# Patient Record
Sex: Female | Born: 1955 | Hispanic: Yes
Health system: Northeastern US, Academic
[De-identification: ages and names within clinical notes are randomized; demographics above are authoritative.]

---

## 2019-06-07 ENCOUNTER — Ambulatory Visit: Admit: 2019-06-07 | Payer: PRIVATE HEALTH INSURANCE

## 2019-06-12 ENCOUNTER — Ambulatory Visit: Admit: 2019-06-12 | Payer: PRIVATE HEALTH INSURANCE

## 2019-06-12 ENCOUNTER — Ambulatory Visit: Admit: 2019-06-12 | Payer: PRIVATE HEALTH INSURANCE | Attending: Neurology

## 2019-07-01 ENCOUNTER — Ambulatory Visit: Admit: 2019-07-01 | Payer: PRIVATE HEALTH INSURANCE

## 2019-07-22 ENCOUNTER — Inpatient Hospital Stay: Admit: 2019-07-22 | Discharge: 2019-07-22 | Payer: MEDICARE

## 2019-07-25 ENCOUNTER — Telehealth: Admit: 2019-07-25 | Payer: PRIVATE HEALTH INSURANCE | Attending: Neurology

## 2019-07-25 ENCOUNTER — Ambulatory Visit: Admit: 2019-07-25 | Payer: MEDICARE | Attending: Neurology

## 2019-07-25 NOTE — Telephone Encounter
LVM to inquire about 2:30 pm telehealth appt that patient did not log on to. I asked that they please contact our office if they plan on logging on or to r/s appt.

## 2019-08-06 ENCOUNTER — Telehealth: Admit: 2019-08-06 | Payer: PRIVATE HEALTH INSURANCE | Attending: Neurology

## 2019-08-06 NOTE — Telephone Encounter
Patient's sister, Hulan Fess, called wanting to make Dr. Mikal Plane and team aware that Nielle will be going to [place] very soon for an unknown length of time.Hulan Fess states that her brother will be bringing her there and she will be followed by a doctor there. Hulan Fess let me know that her brother will be passing Dr. Jackelyn Knife contact information to that doctor and that they may be in touch with him regarding Rithika.Just an FYI- no call back needed

## 2019-09-11 ENCOUNTER — Ambulatory Visit: Admit: 2019-09-11 | Payer: PRIVATE HEALTH INSURANCE | Attending: "Endocrinology

## 2019-09-12 ENCOUNTER — Telehealth: Admit: 2019-09-12 | Payer: PRIVATE HEALTH INSURANCE | Attending: "Endocrinology

## 2019-09-12 NOTE — Telephone Encounter
appt for 6.9 still showing as scheduled, pt might of no showed might need rescheduling.

## 2019-10-22 ENCOUNTER — Encounter: Admit: 2019-10-22 | Payer: PRIVATE HEALTH INSURANCE | Attending: "Endocrinology

## 2019-10-24 MED ORDER — DESMOPRESSIN 0.1 MG TABLET
0.1 mg | ORAL_TABLET | 2 refills | Status: AC
Start: 2019-10-24 — End: 2020-04-28

## 2019-10-28 ENCOUNTER — Encounter: Admit: 2019-10-28 | Payer: PRIVATE HEALTH INSURANCE | Attending: Medical

## 2019-10-29 ENCOUNTER — Encounter: Admit: 2019-10-29 | Payer: MEDICARE | Attending: Neurology

## 2019-10-29 MED ORDER — LEVETIRACETAM IMMEDIATE RELEASE 1,000 MG TABLET
1000 mg | ORAL_TABLET | 2 refills | Status: AC
Start: 2019-10-29 — End: 2020-04-30

## 2020-01-23 ENCOUNTER — Telehealth: Admit: 2020-01-23 | Payer: PRIVATE HEALTH INSURANCE | Attending: Neurological Surgery

## 2020-01-23 NOTE — Telephone Encounter
TC to Sister to find out if Nichole Gomez was still in [place] or has she returned.  Sister states she is still there, doing well.

## 2020-04-23 ENCOUNTER — Encounter: Admit: 2020-04-23 | Payer: PRIVATE HEALTH INSURANCE | Attending: "Endocrinology

## 2020-04-23 NOTE — Telephone Encounter
Last note states she is in [place], so can we check with her family to see if this refill is needed and/or if she will be back in the [place] for f/u at any point?

## 2020-04-26 ENCOUNTER — Encounter: Admit: 2020-04-26 | Payer: PRIVATE HEALTH INSURANCE | Attending: "Endocrinology

## 2020-04-27 ENCOUNTER — Telehealth: Admit: 2020-04-27 | Payer: PRIVATE HEALTH INSURANCE | Attending: "Endocrinology

## 2020-04-27 NOTE — Telephone Encounter
Called patient's phone numbers however no answer. Called pt's contact and LM on VM asking if patient has relocated and to call the office @ [phone] to advise [id number] moving forward. Electronically Signed by Jeanell Sparrow, RN, April 27, 2020

## 2020-04-27 NOTE — Telephone Encounter
Left voicemail message on Hong (sister Etineia's number) for a call back to the office [phone]) to find out if refill is still needed since patient is believed to be out of the country in [place]. Electronically signed by Patrcia Dolly RN, BSN 04/27/2020

## 2020-04-28 ENCOUNTER — Encounter: Admit: 2020-04-28 | Payer: MEDICARE | Attending: Neurology

## 2020-04-28 MED ORDER — DESMOPRESSIN 0.1 MG TABLET
0.1 mg | ORAL_TABLET | Freq: Two times a day (BID) | ORAL | 4 refills | Status: AC
Start: 2020-04-28 — End: 2021-04-28

## 2020-04-28 MED ORDER — PREDNISONE 1 MG TABLET
1 mg | ORAL_TABLET | 4 refills | Status: AC
Start: 2020-04-28 — End: 2021-04-28

## 2020-04-28 MED ORDER — LEVOTHYROXINE 88 MCG TABLET
88 MCG | ORAL_TABLET | Freq: Every day | ORAL | 5 refills | Status: AC
Start: 2020-04-28 — End: ?

## 2020-04-28 NOTE — Telephone Encounter
Confirm that refill needed. Not sure if the pt is in the [place] anymore

## 2020-04-28 NOTE — Telephone Encounter
Per patient's sister she is still in [place] but needs the refill. Her family picvks it up at the pharmacy and gets it to her.

## 2020-04-29 ENCOUNTER — Encounter: Admit: 2020-04-29 | Payer: PRIVATE HEALTH INSURANCE

## 2020-04-29 NOTE — Telephone Encounter
Spoke to [place], informed her that prescriptions for prednisone, LT4 and DDAVP were all sent to the pharmacy for [facility]. She stated that she would mail the medications to her sister once she picked them up. Hulan Fess will call the office [phone]) if there are any further issues to address. Electronically signed by Patrcia Dolly RN, BSN 04/29/2020

## 2020-04-30 ENCOUNTER — Encounter: Admit: 2020-04-30 | Payer: PRIVATE HEALTH INSURANCE | Attending: Medical

## 2020-04-30 MED ORDER — LEVETIRACETAM IMMEDIATE RELEASE 1,000 MG TABLET
1000 mg | ORAL_TABLET | Freq: Two times a day (BID) | ORAL | 2 refills | Status: AC
Start: 2020-04-30 — End: 2020-10-22

## 2020-10-22 ENCOUNTER — Encounter: Admit: 2020-10-22 | Payer: PRIVATE HEALTH INSURANCE | Attending: Medical

## 2020-10-22 MED ORDER — LEVETIRACETAM IMMEDIATE RELEASE 1,000 MG TABLET
1000 mg | ORAL_TABLET | 2 refills | Status: AC
Start: 2020-10-22 — End: 2021-04-27

## 2021-04-26 ENCOUNTER — Encounter: Admit: 2021-04-26 | Payer: PRIVATE HEALTH INSURANCE | Attending: "Endocrinology

## 2021-04-26 ENCOUNTER — Encounter: Admit: 2021-04-26 | Payer: PRIVATE HEALTH INSURANCE | Attending: Medical

## 2021-04-27 ENCOUNTER — Telehealth: Admit: 2021-04-27 | Payer: PRIVATE HEALTH INSURANCE | Attending: Neurology

## 2021-04-27 ENCOUNTER — Encounter: Admit: 2021-04-27 | Payer: PRIVATE HEALTH INSURANCE | Attending: Medical

## 2021-04-27 MED ORDER — LEVETIRACETAM IMMEDIATE RELEASE 1,000 MG TABLET
1000 mg | ORAL_TABLET | 1 refills | Status: AC
Start: 2021-04-27 — End: ?

## 2021-04-27 NOTE — Telephone Encounter
LVM for patient to discuss the following below and inquire on current status of care; asked for a call back to discuss/clarify. 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 Thank you! Erie Noe

## 2021-04-27 NOTE — Telephone Encounter
Patient's sister, Hulan Fess, called stating she was returning a missed call / message from our office earlier today.Ph #: [phone]

## 2021-04-27 NOTE — Telephone Encounter
RTC to patient's sister, who confirmed that patient is still in [place] and the providers there are having trouble finding Keppra medication available in their area. I informed sister our APP will send over 30 day supply of Keppra to local pharmacy which sister will get to patient and will review if OK to send additional supply given circumstance. Sister confirmed she is not exactly sure when patient plans to return but will contact us if/when this occurs.

## 2021-04-28 MED ORDER — DESMOPRESSIN 0.1 MG TABLET
0.1 mg | ORAL_TABLET | Freq: Two times a day (BID) | ORAL | 2 refills | Status: AC
Start: 2021-04-28 — End: ?

## 2021-04-28 MED ORDER — PREDNISONE 1 MG TABLET
1 mg | ORAL_TABLET | 2 refills | Status: AC
Start: 2021-04-28 — End: ?

## 2021-04-28 NOTE — Telephone Encounter
Called to speak to pt regarding medication refill requests and Dr. Phill Mutter note below, no answer.  Left detailed message discussing Dr. Phill Mutter note below and to call the office with any questions/concerns.  Electronically Signed by Benn Moulder, RN, January 25, 2023Peter, Elease Hashimoto, MD  You 6 minutes ago (10:15 AM) PPWill give 6 months supply. Needs to find new endo in [place] who can prescribe this to her  desmopressin (DDAVP) 0.1 mg tablet      Sig: TAKE 1 TABLET (100 MCG TOTAL) BY MOUTH 2 (TWO) TIMES DAILY.  Disp:  180 tablet ?? Refills:  1 (Pharmacy requested: 3)  Start: 04/28/2021  Class: Normal  Authorized by: Carmon Sails, MD     predniSONE (DELTASONE) 1 mg tablet     Sig: TAKE 2 TABLETS BY MOUTH EVERY MORNING AND 1 TAB IN EVENING  Disp:  270 tablet ?? Refills:  1 (Pharmacy requested: 3)  Start: 04/28/2021  Class: Normal  Authorized by: Carmon Sails, MD    To be filled at: [phone] - [place], [place] - [address]

## 2021-07-26 ENCOUNTER — Encounter: Admit: 2021-07-26 | Payer: PRIVATE HEALTH INSURANCE | Attending: Medical

## 2021-07-26 ENCOUNTER — Encounter: Admit: 2021-07-26 | Payer: PRIVATE HEALTH INSURANCE | Attending: "Endocrinology

## 2021-07-26 NOTE — Telephone Encounter
Left message on Nichole Gomez's voicemail asking her to call the office [phone]) in regards to her request for medication refill.Spoke to [name] (pharmacist at [facility]) informed him that Dr Mikal Plane has not seen the patient in 2 years. She will need to call our office to set up an appointment or else get her medication from her current medical provider. Electronically signed by Patrcia Dolly RN, BSN 07/26/2021

## 2021-07-27 NOTE — Telephone Encounter
Needs to establish care with new providers in [place] to get further refills, as she is no longer in [place]

## 2021-10-26 ENCOUNTER — Encounter: Admit: 2021-10-26 | Payer: PRIVATE HEALTH INSURANCE | Attending: "Endocrinology

## 2021-10-27 NOTE — Telephone Encounter
Needs to establish care with new providers in [place] to get further refills, as she is no longer in [place]

## 2024-01-10 IMAGING — MR RM SELA TURCICA
8 of 13 series · 15 of 48 positions shown · non-contrast
Comparison: none

[Series 2: FLAIR · axial · 5.0mm · 0.49mm/px · z∈[-4,+114]mm · 2 of 24 slices shown]
[im 1/24]
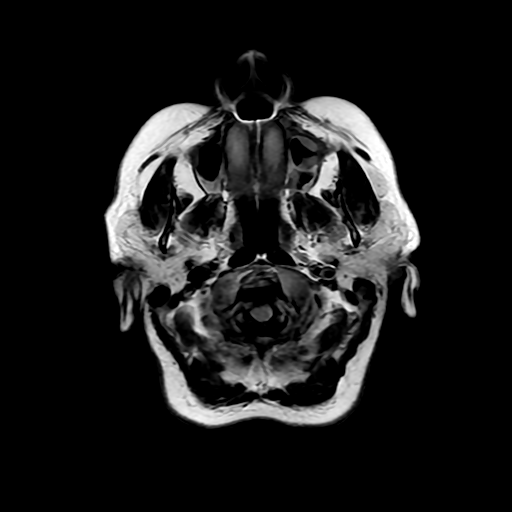
[im 24/24]
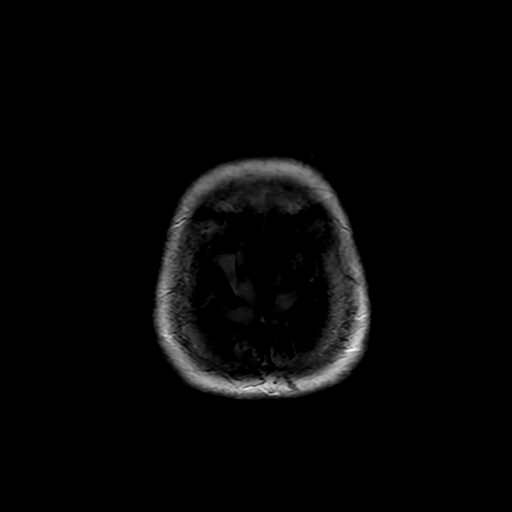

[Series 3: T1 · sagittal · 2.5mm · 0.13mm/px · 1 of 12 slices shown (1 of 3)]
[im 1/12]
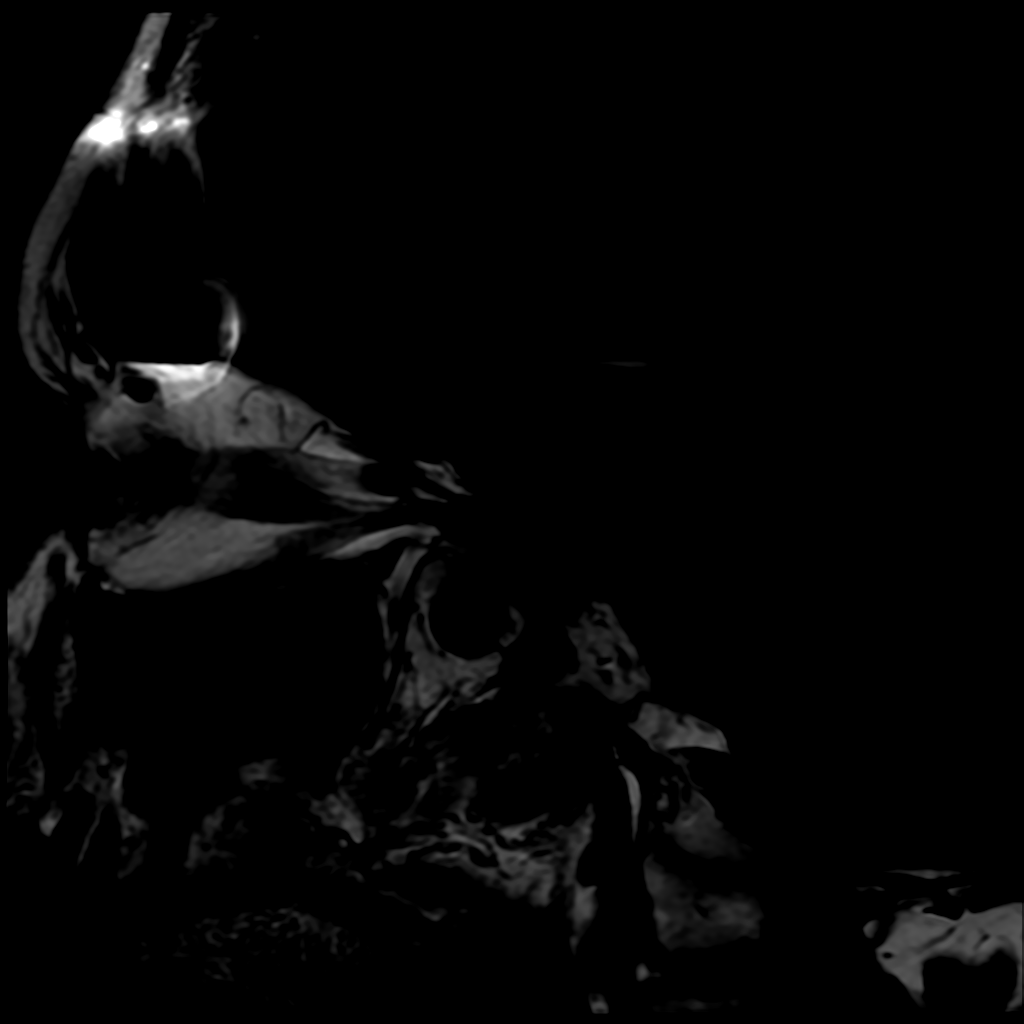

[Series 4: DWI · axial · 5.0mm · 0.98mm/px · z∈[-4,+114]mm · 5 of 48 slices shown]
[im 1/48]
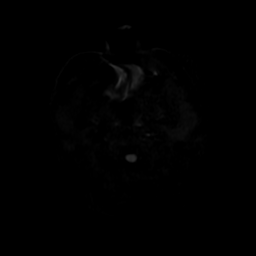
[im 12/48]
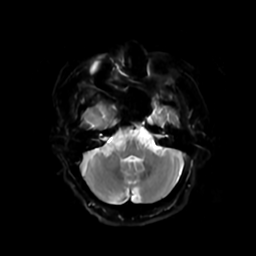
[im 24/48]
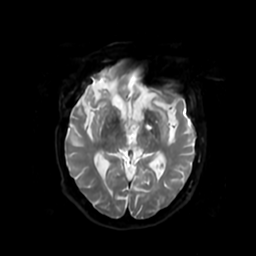
[im 36/48]
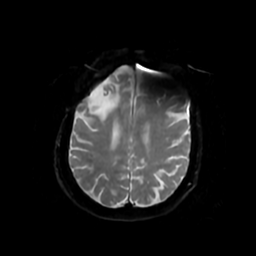
[im 48/48]
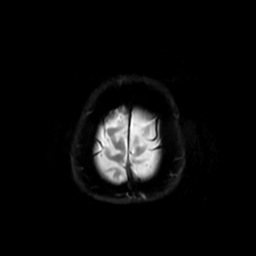

[Series 5: T2 · coronal · 2.5mm · 0.12mm/px · 1 of 8 slices shown]
[im 1/8]
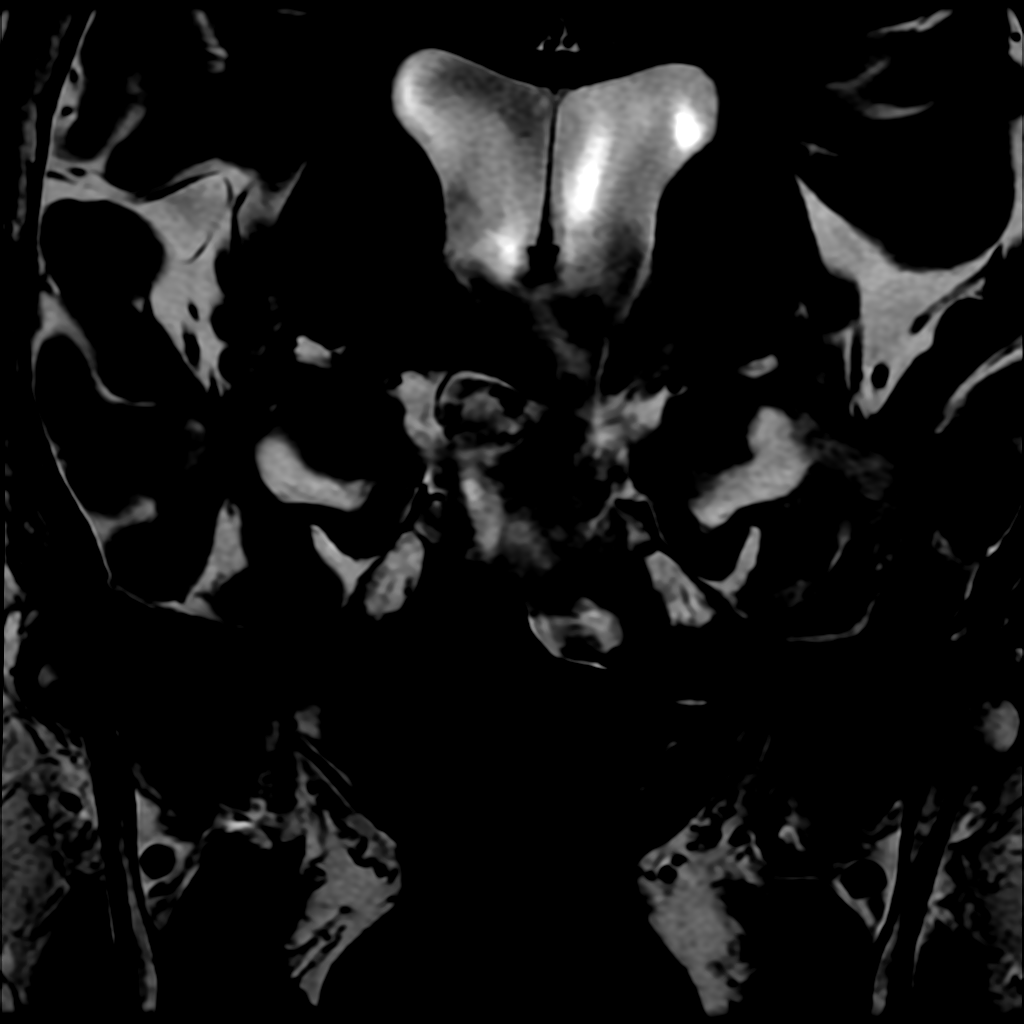

[Series 6: T1 · coronal · 2.5mm · 0.12mm/px · 1 of 8 slices shown (2 of 3)]
[im 1/8]
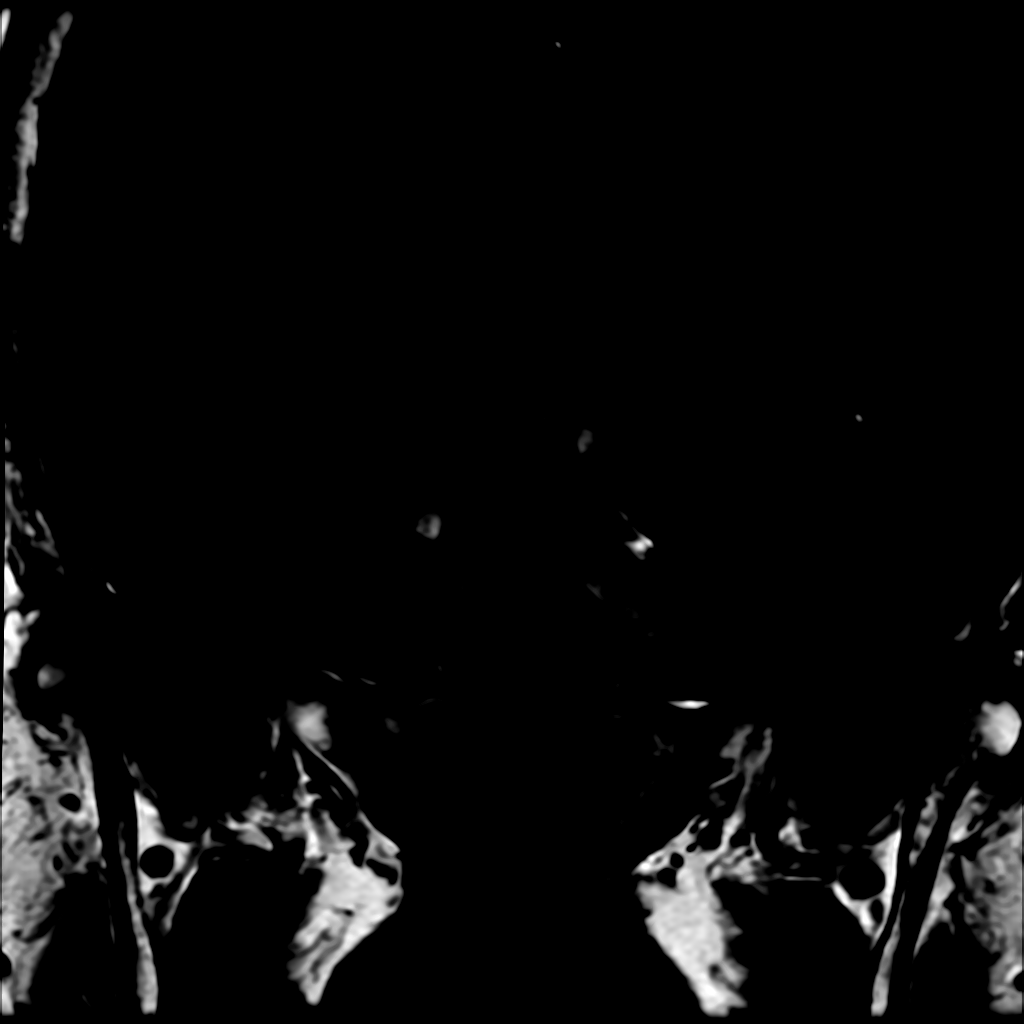

[Series 7: T1 · sagittal · 2.5mm · 0.13mm/px · 1 of 12 slices shown (3 of 3)]
[im 1/12]
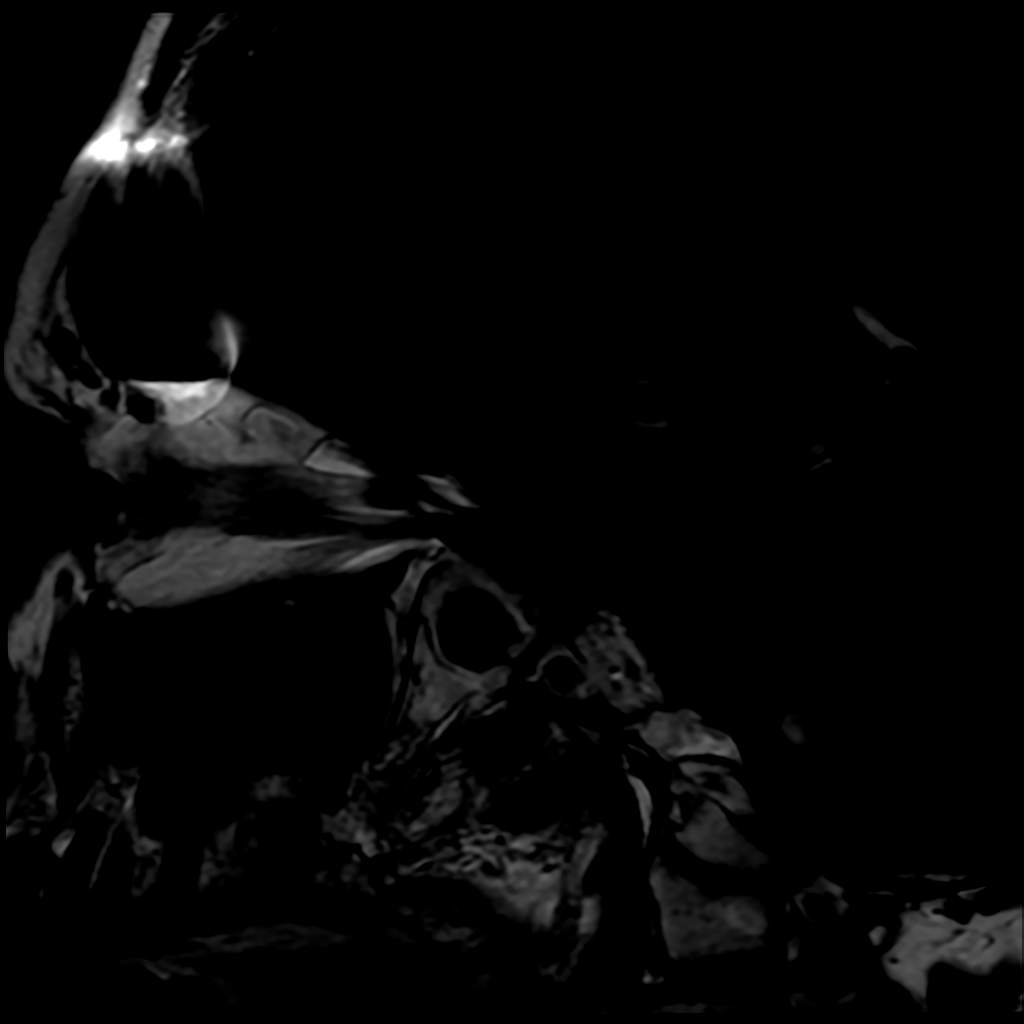

[Series 450: ADC · axial · 5.0mm · 0.98mm/px · z∈[-4,+114]mm · 2 of 24 slices shown (1 of 2)]
[im 1/24]
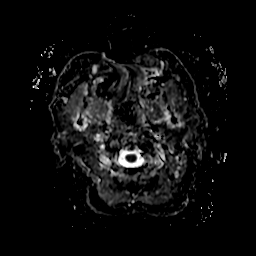
[im 24/24]
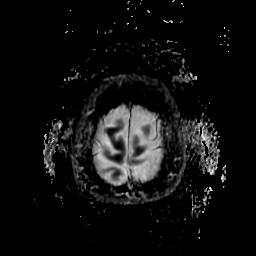

[Series 452: ADC · axial · 5.0mm · 0.98mm/px · z∈[-4,+114]mm · 2 of 24 slices shown (2 of 2)]
[im 1/24]
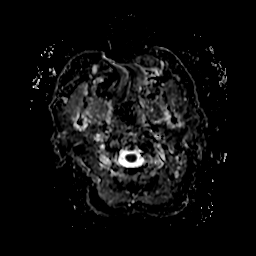
[im 24/24]
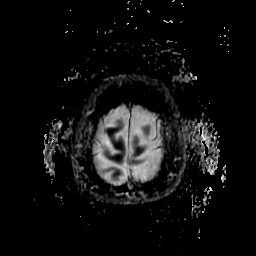

[15 of 48 positions shown; findings below may reference images not displayed]

METODOLOGIA:
Exame realizado com sequências ponderadas em T1 e T2, antes e após a administração intravenosa do agente de
contraste paramagnético.
ANÁLISE:
Sinais de craniotomias pterionais bilaterais com alterações sequelares fronto temporais bilaterais.
Sinais de manipulação cirúrgica da cavidade selar por via transfenoidal, com parênquima glandular remanescente
revestindo o assoalho da cavidade selar.
RESSONÂNCIA MAGNÉTICA DA SELA TÚRCICA
Má deﬁnição da haste hipoﬁsária e do lobo posterior hipoﬁsário, sugerindo alterações pós-cirúrgicas.
Imagem compatível com tecido adiposo revestindo o assoalho da cavidade selar.
Lesão expansiva compatível com lesão recidivada/remanescente projetada na região hipotalâmica à direita e transição
do quiasma óptico com o trato óptico direito, medindo 1,5 x 0,9 x 0,6 cm (volume estimado em 0,42 cm³), com realce
pelo meio de contraste. 
Seios cavernosos com realce homogêneo pelo meio de contraste.
IMPRESSÃO:
Exame pós-operatório com sinais de recidiva/remanescente lesional.
